# Patient Record
Sex: Male | Born: 1995 | Race: Black or African American | Hispanic: No | Marital: Single | Smoking: Current some day smoker
Health system: Southern US, Community
[De-identification: ages and names within clinical notes are randomized; demographics above are authoritative.]

---

## 2018-10-24 ENCOUNTER — Ambulatory Visit (HOSPITAL_COMMUNITY)
Admission: EM | Admit: 2018-10-24 | Discharge: 2018-10-24 | Disposition: A | Discharge status: Home / Self Care | Payer: Self-pay | Attending: Family Medicine | Admitting: Family Medicine

## 2018-10-24 ENCOUNTER — Encounter (HOSPITAL_COMMUNITY): Payer: Self-pay

## 2018-10-24 DIAGNOSIS — M542 Cervicalgia: Secondary | ICD-10-CM

## 2018-10-24 DIAGNOSIS — M546 Pain in thoracic spine: Secondary | ICD-10-CM

## 2018-10-24 MED ORDER — NAPROXEN 500 MG PO TABS: 500.0000 mg | ORAL_TABLET | Freq: Two times a day (BID) | ORAL | 0 refills | Status: DC

## 2018-10-24 MED ORDER — CYCLOBENZAPRINE HCL 10 MG PO TABS: 10.0000 mg | ORAL_TABLET | Freq: Two times a day (BID) | ORAL | 0 refills | Status: DC | PRN

## 2018-10-24 NOTE — Discharge Instructions (Signed)
Use anti-inflammatories for pain/swelling. You may take up to 800 mg Ibuprofen every 8 hours with food. You may supplement Ibuprofen with Tylenol [phone] mg every 8 hours.  OR  Naprosyn twice daily with food  You may use flexeril as needed to help with pain. This is a muscle relaxer and causes sedation- please use only at bedtime or when you will be home and not have to drive/work- cut in 1/2 if causing toom uch drowsiness  Alternate ice and heat
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
## 2018-10-28 ENCOUNTER — Ambulatory Visit (HOSPITAL_COMMUNITY)
Admission: EM | Admit: 2018-10-28 | Discharge: 2018-10-28 | Disposition: A | Discharge status: Home / Self Care | Payer: Self-pay | Attending: Emergency Medicine | Admitting: Emergency Medicine

## 2018-10-28 ENCOUNTER — Encounter (HOSPITAL_COMMUNITY): Payer: Self-pay | Admitting: Emergency Medicine

## 2018-10-28 DIAGNOSIS — M546 Pain in thoracic spine: Secondary | ICD-10-CM

## 2018-10-28 MED ORDER — MELOXICAM 15 MG PO TABS: 15.0000 mg | ORAL_TABLET | Freq: Every day | ORAL | 0 refills | Status: DC

## 2018-10-28 NOTE — Discharge Instructions (Addendum)
°  Meloxicam (Mobic) is an antiinflammatory to help with pain and inflammation.  Do not take ibuprofen, Advil, Aleve, or any other medications that contain NSAIDs while taking meloxicam as this may cause stomach upset or even ulcers if taken in large amounts for an extended period of time.   You may take acetaminophen (Tylenol) 500mg  every 4-6 hours along with the Meloxicam once daily as needed for pain.  You may apply cool and warm compresses to help with pain.   Please follow up with family medicine or an orthopedist in 1-2 weeks if not improving.
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
## 2018-10-29 ENCOUNTER — Ambulatory Visit (INDEPENDENT_AMBULATORY_CARE_PROVIDER_SITE_OTHER): Payer: BLUE CROSS/BLUE SHIELD | Admitting: Family Medicine

## 2018-10-29 ENCOUNTER — Encounter (INDEPENDENT_AMBULATORY_CARE_PROVIDER_SITE_OTHER): Payer: Self-pay | Admitting: Family Medicine

## 2018-10-29 DIAGNOSIS — M546 Pain in thoracic spine: Secondary | ICD-10-CM

## 2018-10-29 DIAGNOSIS — M542 Cervicalgia: Secondary | ICD-10-CM | POA: Diagnosis not present
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
## 2019-03-31 ENCOUNTER — Encounter (HOSPITAL_COMMUNITY): Payer: Self-pay | Admitting: *Deleted

## 2019-03-31 ENCOUNTER — Other Ambulatory Visit: Payer: Self-pay

## 2019-03-31 ENCOUNTER — Ambulatory Visit (HOSPITAL_COMMUNITY)
Admission: EM | Admit: 2019-03-31 | Discharge: 2019-03-31 | Disposition: A | Discharge status: Home / Self Care | Payer: BLUE CROSS/BLUE SHIELD | Attending: Family Medicine | Admitting: Family Medicine

## 2019-03-31 DIAGNOSIS — R350 Frequency of micturition: Secondary | ICD-10-CM | POA: Diagnosis present

## 2019-03-31 DIAGNOSIS — Z202 Contact with and (suspected) exposure to infections with a predominantly sexual mode of transmission: Secondary | ICD-10-CM

## 2019-03-31 LAB — POCT URINALYSIS DIP (DEVICE)
Bilirubin Urine: NEGATIVE
Glucose, UA: NEGATIVE mg/dL
Hgb urine dipstick: NEGATIVE
Ketones, ur: NEGATIVE mg/dL
Leukocytes,Ua: NEGATIVE
Nitrite: NEGATIVE
Protein, ur: NEGATIVE mg/dL
Specific Gravity, Urine: >=1.03 (ref 1.005–1.030)
Urobilinogen, UA: 0.2 mg/dL (ref 0.0–1.0)
pH: 6 (ref 5.0–8.0)

## 2019-03-31 MED ORDER — SULFAMETHOXAZOLE-TRIMETHOPRIM 800-160 MG PO TABS: 1.0000 | ORAL_TABLET | Freq: Two times a day (BID) | ORAL | 0 refills | Status: AC

## 2019-03-31 NOTE — Discharge Instructions (Signed)
Continue to drink plenty of water Take the antibiotic 2 x a day We did lab testing during this visit.  If there are any abnormal findings that require change in medicine or indicate a positive result, you will be notified.  If all of your tests are normal, you will not be called.

## 2019-03-31 NOTE — ED Provider Notes (Signed)
[facility]    CSN: [id number] Arrival date & time: 03/31/19  1259     History   Chief Complaint Chief Complaint  Patient presents with  . Urinary Retention    HPI Daniel Richardson is a 23 y.o. male.   HPI  Healthy on no meds Drinks a lot of water Has noticed urine frequency and the sensation that he is not emptying bladder for 2 d No hematuria, burning, purulence or rash +unprotected sex 1-2 weeks ago Desires STD testing Declines the labs HIV and RPR against my advice No abdominal or back pain No fever or chills No history kidney problems or stones No meds OTC or supplements  History reviewed. No pertinent past medical history.  There are no active problems to display for this patient.   History reviewed. No pertinent surgical history.     Home Medications    Prior to Admission medications   Medication Sig Start Date End Date Taking? Authorizing Provider  sulfamethoxazole-trimethoprim (BACTRIM DS) 800-160 MG tablet Take 1 tablet by mouth 2 (two) times daily for 10 days. 03/31/19 04/10/19  Eustace Moore, MD    Family History Family History  Problem Relation Age of Onset  . Healthy Mother   . Healthy Father     Social History Social History   Tobacco Use  . Smoking status: Never Smoker  . Smokeless tobacco: Never Used  Substance Use Topics  . Alcohol use: Not Currently  . Drug use: Yes    Types: Marijuana     Allergies   Lavender oil   Review of Systems Review of Systems  Constitutional: Negative for chills and fever.  HENT: Negative for ear pain and sore throat.   Eyes: Negative for pain and visual disturbance.  Respiratory: Negative for cough and shortness of breath.   Cardiovascular: Negative for chest pain and palpitations.  Gastrointestinal: Negative for abdominal pain and vomiting.  Genitourinary: Positive for frequency. Negative for decreased urine volume, difficulty urinating, discharge, dysuria, flank pain, genital  sores, hematuria and urgency.  Musculoskeletal: Negative for arthralgias and back pain.  Skin: Negative for color change and rash.  Neurological: Negative for seizures and syncope.  All other systems reviewed and are negative.    Physical Exam Triage Vital Signs ED Triage Vitals  Enc Vitals Group     BP 03/31/19 1311 128/85     Pulse Rate 03/31/19 1311 64     Resp 03/31/19 1311 16     Temp 03/31/19 1311 98.1 F (36.7 C)     Temp src --      SpO2 03/31/19 1311 100 %   No data found.  Updated Vital Signs BP 128/85   Pulse 64   Temp 98.1 F (36.7 C)   Resp 16   SpO2 100%      Physical Exam Constitutional:      General: He is not in acute distress.    Appearance: He is well-developed.  HENT:     Head: Normocephalic and atraumatic.  Eyes:     Conjunctiva/sclera: Conjunctivae normal.     Pupils: Pupils are equal, round, and reactive to light.  Neck:     Musculoskeletal: Normal range of motion.  Cardiovascular:     Rate and Rhythm: Normal rate.  Pulmonary:     Effort: Pulmonary effort is normal. No respiratory distress.  Abdominal:     General: There is no distension.     Palpations: Abdomen is soft.  Genitourinary:    Comments: Exam  deferred.  Rectal refused Musculoskeletal: Normal range of motion.  Skin:    General: Skin is warm and dry.  Neurological:     Mental Status: He is alert.  Psychiatric:        Mood and Affect: Mood normal.        Behavior: Behavior normal.      UC Treatments / Results  Labs (all labs ordered are listed, but only abnormal results are displayed) Labs Reviewed  URINE CULTURE  POCT URINALYSIS DIP (DEVICE)  URINE CYTOLOGY ANCILLARY ONLY    EKG None  Radiology No results found.  Procedures Procedures (including critical care time)  Medications Ordered in UC Medications - No data to display  Initial Impression / Assessment and Plan / UC Course  I have reviewed the triage vital signs and the nursing notes.   Pertinent labs & imaging results that were available during my care of the patient were reviewed by me and considered in my medical decision making (see chart for details).     Discussed causes of true urinary retention including mechanical - stone, prostate, pharmacology, neurologic.  Will treat for UTI pending STD testing Final Clinical Impressions(s) / UC Diagnoses   Final diagnoses:  Urine frequency  Possible exposure to STD     Discharge Instructions     Continue to drink plenty of water Take the antibiotic 2 x a day We did lab testing during this visit.  If there are any abnormal findings that require change in medicine or indicate a positive result, you will be notified.  If all of your tests are normal, you will not be called.      ED Prescriptions    Medication Sig Dispense Auth. Provider   sulfamethoxazole-trimethoprim (BACTRIM DS) 800-160 MG tablet Take 1 tablet by mouth 2 (two) times daily for 10 days. 20 tablet Eustace MooreNelson, Olevia Westervelt Sue, MD     Controlled Substance Prescriptions [place] Controlled Substance Registry consulted? Not Applicable   Eustace MooreNelson, Racheal Mathurin Sue, MD 03/31/19 1401

## 2019-03-31 NOTE — ED Triage Notes (Signed)
C/O feeling urinary retention and urinary urgency x 2 days.  Denies any penile discharge or pain.  Denies fevers.

## 2019-04-01 LAB — URINE CULTURE: Culture: NO GROWTH

## 2019-04-01 LAB — URINE CYTOLOGY ANCILLARY ONLY
Chlamydia: NEGATIVE
Neisseria Gonorrhea: NEGATIVE
Trichomonas: NEGATIVE

## 2019-11-10 ENCOUNTER — Ambulatory Visit (HOSPITAL_COMMUNITY)
Admission: EM | Admit: 2019-11-10 | Discharge: 2019-11-10 | Disposition: A | Discharge status: Home / Self Care | Payer: BLUE CROSS/BLUE SHIELD

## 2019-11-10 NOTE — ED Notes (Signed)
No call from patient in the lobby for registration.

## 2020-05-17 ENCOUNTER — Encounter (HOSPITAL_COMMUNITY): Payer: Self-pay

## 2020-05-17 ENCOUNTER — Ambulatory Visit (HOSPITAL_COMMUNITY)
Admission: EM | Admit: 2020-05-17 | Discharge: 2020-05-17 | Disposition: A | Discharge status: Home / Self Care | Payer: BLUE CROSS/BLUE SHIELD | Attending: Family Medicine | Admitting: Family Medicine

## 2020-05-17 DIAGNOSIS — M542 Cervicalgia: Secondary | ICD-10-CM | POA: Diagnosis not present

## 2020-05-17 DIAGNOSIS — S161XXA Strain of muscle, fascia and tendon at neck level, initial encounter: Secondary | ICD-10-CM

## 2020-05-17 MED ORDER — CYCLOBENZAPRINE HCL 10 MG PO TABS: 10.0000 mg | ORAL_TABLET | Freq: Two times a day (BID) | ORAL | 0 refills | Status: AC | PRN

## 2020-05-17 MED ORDER — IBUPROFEN 800 MG PO TABS: 800.0000 mg | ORAL_TABLET | Freq: Three times a day (TID) | ORAL | 0 refills | Status: AC | PRN

## 2020-05-17 NOTE — Discharge Instructions (Signed)
Prescribe prescription strength ibuprofen as well as Flexeril.  Flexeril can make you sleepy so do not drive or operate heavy machinery while taking this medication.  You have strained your neck muscle while you were sleeping.  If you are not getting better over the next day or 2, follow-up with this office or with primary care.  Follow-up in the ER with trouble swallowing, trouble breathing, neck swelling, throat swelling, other concerning symptoms.

## 2020-05-17 NOTE — ED Triage Notes (Signed)
Pt presents with left sided neck pain. Pt states he slept on his neck wrong, woke up with pain. Pt unable to bring head midline, position of comfort is to lean head right. Pt also states pain is worse when lifting arms above head. Pt denies OTC treatment or relieving factors. Pt denies injury or trauma to neck.

## 2020-05-17 NOTE — ED Provider Notes (Signed)
[facility]   [id number] 05/17/20 Arrival Time: 1050  QQ:IWLNL PAIN  SUBJECTIVE: History from: patient. Adal Sereno is a 24 y.o. male complains of left neck pain that began this morning. Reports that he thinks that he slept wrong last night.  Denies a precipitating event or specific injury. Describes the pain as constant and achy in character. Has tried OTC medications without relief. Symptoms are made worse with activity. Denies similar symptoms in the past. Denies fever, chills, erythema, ecchymosis, effusion, weakness, numbness and tingling, saddle paresthesias, loss of bowel or bladder function.      ROS: As per HPI.  All other pertinent ROS negative.     History reviewed. No pertinent past medical history. History reviewed. No pertinent surgical history. Allergies  Allergen Reactions  . Lavender Oil    No current facility-administered medications on file prior to encounter.   No current outpatient medications on file prior to encounter.   Social History   Socioeconomic History  . Marital status: Single    Spouse name: Not on file  . Number of children: Not on file  . Years of education: Not on file  . Highest education level: Not on file  Occupational History  . Not on file  Tobacco Use  . Smoking status: Never Smoker  . Smokeless tobacco: Never Used  Vaping Use  . Vaping Use: Never used  Substance and Sexual Activity  . Alcohol use: Not Currently  . Drug use: Yes    Types: Marijuana  . Sexual activity: Yes    Birth control/protection: None  Other Topics Concern  . Not on file  Social History Narrative  . Not on file   Social Determinants of Health   Financial Resource Strain:   . Difficulty of Paying Living Expenses:   Food Insecurity:   . Worried About Programme researcher, broadcasting/film/video in the Last Year:   . Barista in the Last Year:   Transportation Needs:   . Freight forwarder (Medical):   Marland Kitchen Lack of Transportation (Non-Medical):   Physical  Activity:   . Days of Exercise per Week:   . Minutes of Exercise per Session:   Stress:   . Feeling of Stress :   Social Connections:   . Frequency of Communication with Friends and Family:   . Frequency of Social Gatherings with Friends and Family:   . Attends Religious Services:   . Active Member of Clubs or Organizations:   . Attends Banker Meetings:   Marland Kitchen Marital Status:   Intimate Partner Violence:   . Fear of Current or Ex-Partner:   . Emotionally Abused:   Marland Kitchen Physically Abused:   . Sexually Abused:    Family History  Problem Relation Age of Onset  . Healthy Mother   . Healthy Father     OBJECTIVE:  Vitals:   05/17/20 1108  BP: 134/80  Pulse: 81  Temp: 97.9 F (36.6 C)  SpO2: 99%    General appearance: ALERT; in no acute distress.  Head: NCAT Lungs: Normal respiratory effort CV:  pulses 2+ bilaterally. Cap refill < 2 seconds Musculoskeletal:  Inspection: Skin warm, dry, clear and intact without obvious erythema, effusion, or ecchymosis.  Palpation: Nontender to palpation ROM: FROM active and passive Skin: warm and dry Neurologic: Ambulates without difficulty; Sensation intact about the upper/ lower extremities Psychological: alert and cooperative; normal mood and affect  DIAGNOSTIC STUDIES:  No results found.   ASSESSMENT & PLAN:  1. Neck  pain   2. Acute strain of neck muscle, initial encounter     Meds ordered this encounter  Medications  . ibuprofen (ADVIL) 800 MG tablet    Sig: Take 1 tablet (800 mg total) by mouth every 8 (eight) hours as needed for moderate pain.    Dispense:  21 tablet    Refill:  0    Order Specific Question:   Supervising Provider    Answer:   Chase Picket [id number]  . cyclobenzaprine (FLEXERIL) 10 MG tablet    Sig: Take 1 tablet (10 mg total) by mouth 2 (two) times daily as needed for muscle spasms.    Dispense:  20 tablet    Refill:  0    Order Specific Question:   Supervising Provider    Answer:    Chase Picket [id number]    Continue conservative management of rest, ice, and gentle stretches Take ibuprofen as needed for pain relief (may cause abdominal discomfort, ulcers, and GI bleeds avoid taking with other NSAIDs) Take cyclobenzaprine at nighttime for symptomatic relief. Avoid driving or operating heavy machinery while using medication. Follow up with PCP if symptoms persist Return or go to the ER if you have any new or worsening symptoms (fever, chills, chest pain, abdominal pain, changes in bowel or bladder habits, pain radiating into lower legs)   Reviewed expectations re: course of current medical issues. Questions answered. Outlined signs and symptoms indicating need for more acute intervention. Patient verbalized understanding. After Visit Summary given.       Faustino Congress, NP 05/17/20 1130

## 2020-05-28 ENCOUNTER — Inpatient Hospital Stay (HOSPITAL_COMMUNITY)
Admission: EM | Admit: 2020-05-28 | Discharge: 2020-06-01 | DRG: 199 | Disposition: A | Discharge status: Home / Self Care | Payer: BC Managed Care – PPO | Attending: General Surgery | Admitting: General Surgery

## 2020-05-28 ENCOUNTER — Other Ambulatory Visit: Payer: Self-pay

## 2020-05-28 ENCOUNTER — Emergency Department (HOSPITAL_COMMUNITY): Payer: BC Managed Care – PPO

## 2020-05-28 ENCOUNTER — Encounter (HOSPITAL_COMMUNITY): Payer: Self-pay | Admitting: *Deleted

## 2020-05-28 DIAGNOSIS — S2232XB Fracture of one rib, left side, initial encounter for open fracture: Secondary | ICD-10-CM | POA: Diagnosis present

## 2020-05-28 DIAGNOSIS — Z20822 Contact with and (suspected) exposure to covid-19: Secondary | ICD-10-CM | POA: Diagnosis present

## 2020-05-28 DIAGNOSIS — S272XXA Traumatic hemopneumothorax, initial encounter: Principal | ICD-10-CM | POA: Diagnosis present

## 2020-05-28 DIAGNOSIS — S27321A Contusion of lung, unilateral, initial encounter: Secondary | ICD-10-CM | POA: Diagnosis present

## 2020-05-28 DIAGNOSIS — N179 Acute kidney failure, unspecified: Secondary | ICD-10-CM | POA: Diagnosis present

## 2020-05-28 DIAGNOSIS — Z4682 Encounter for fitting and adjustment of non-vascular catheter: Secondary | ICD-10-CM

## 2020-05-28 DIAGNOSIS — D62 Acute posthemorrhagic anemia: Secondary | ICD-10-CM | POA: Diagnosis not present

## 2020-05-28 DIAGNOSIS — T1490XA Injury, unspecified, initial encounter: Secondary | ICD-10-CM | POA: Diagnosis present

## 2020-05-28 DIAGNOSIS — S2232XA Fracture of one rib, left side, initial encounter for closed fracture: Secondary | ICD-10-CM | POA: Diagnosis present

## 2020-05-28 DIAGNOSIS — S22079A Unspecified fracture of T9-T10 vertebra, initial encounter for closed fracture: Secondary | ICD-10-CM | POA: Diagnosis present

## 2020-05-28 DIAGNOSIS — Z9689 Presence of other specified functional implants: Secondary | ICD-10-CM

## 2020-05-28 DIAGNOSIS — F17200 Nicotine dependence, unspecified, uncomplicated: Secondary | ICD-10-CM | POA: Diagnosis present

## 2020-05-28 DIAGNOSIS — W3400XA Accidental discharge from unspecified firearms or gun, initial encounter: Secondary | ICD-10-CM

## 2020-05-28 DIAGNOSIS — S21432A Puncture wound without foreign body of left back wall of thorax with penetration into thoracic cavity, initial encounter: Secondary | ICD-10-CM | POA: Diagnosis present

## 2020-05-28 DIAGNOSIS — J942 Hemothorax: Secondary | ICD-10-CM

## 2020-05-28 DIAGNOSIS — R52 Pain, unspecified: Secondary | ICD-10-CM

## 2020-05-28 LAB — COMPREHENSIVE METABOLIC PANEL
ALT: 19 U/L (ref 0–44)
AST: 25 U/L (ref 15–41)
Albumin: 3.9 g/dL (ref 3.5–5.0)
Alkaline Phosphatase: 64 U/L (ref 38–126)
Anion gap: 13 (ref 5–15)
BUN: 22 mg/dL — ABNORMAL HIGH (ref 6–20)
CO2: 22 mmol/L (ref 22–32)
Calcium: 8.7 mg/dL — ABNORMAL LOW (ref 8.9–10.3)
Chloride: 103 mmol/L (ref 98–111)
Creatinine, Ser: 1.37 mg/dL — ABNORMAL HIGH (ref 0.61–1.24)
GFR calc Af Amer: >60 mL/min (ref >60)
GFR calc non Af Amer: >60 mL/min (ref >60)
Glucose, Bld: 204 mg/dL — ABNORMAL HIGH (ref 70–99)
Potassium: 3.5 mmol/L (ref 3.5–5.1)
Sodium: 138 mmol/L (ref 135–145)
Total Bilirubin: 1.2 mg/dL (ref 0.3–1.2)
Total Protein: 6.6 g/dL (ref 6.5–8.1)

## 2020-05-28 LAB — URINALYSIS, ROUTINE W REFLEX MICROSCOPIC
Bilirubin Urine: NEGATIVE
Glucose, UA: NEGATIVE mg/dL
Hgb urine dipstick: NEGATIVE
Ketones, ur: NEGATIVE mg/dL
Leukocytes,Ua: NEGATIVE
Nitrite: NEGATIVE
Protein, ur: NEGATIVE mg/dL
Specific Gravity, Urine: >1.046 — ABNORMAL HIGH (ref 1.005–1.030)
pH: 5 (ref 5.0–8.0)

## 2020-05-28 LAB — CBC
HCT: 40.5 % (ref 39.0–52.0)
Hemoglobin: 12.7 g/dL — ABNORMAL LOW (ref 13.0–17.0)
MCH: 28.6 pg (ref 26.0–34.0)
MCHC: 31.4 g/dL (ref 30.0–36.0)
MCV: 91.2 fL (ref 80.0–100.0)
Platelets: 288 10*3/uL (ref 150–400)
RBC: 4.44 MIL/uL (ref 4.22–5.81)
RDW: 12.4 % (ref 11.5–15.5)
WBC: 7.2 10*3/uL (ref 4.0–10.5)
nRBC: 0 % (ref 0.0–0.2)

## 2020-05-28 LAB — PROTIME-INR
INR: 1.3 — ABNORMAL HIGH (ref 0.8–1.2)
Prothrombin Time: 15.5 seconds — ABNORMAL HIGH (ref 11.4–15.2)

## 2020-05-28 LAB — I-STAT CHEM 8, ED
BUN: 25 mg/dL — ABNORMAL HIGH (ref 6–20)
Calcium, Ion: 1.17 mmol/L (ref 1.15–1.40)
Chloride: 100 mmol/L (ref 98–111)
Creatinine, Ser: 1.3 mg/dL — ABNORMAL HIGH (ref 0.61–1.24)
Glucose, Bld: 198 mg/dL — ABNORMAL HIGH (ref 70–99)
HCT: 39 % (ref 39.0–52.0)
Hemoglobin: 13.3 g/dL (ref 13.0–17.0)
Potassium: 3.6 mmol/L (ref 3.5–5.1)
Sodium: 141 mmol/L (ref 135–145)
TCO2: 26 mmol/L (ref 22–32)

## 2020-05-28 LAB — LACTIC ACID, PLASMA: Lactic Acid, Venous: 5 mmol/L (ref 0.5–1.9)

## 2020-05-28 LAB — SARS CORONAVIRUS 2 BY RT PCR (HOSPITAL ORDER, PERFORMED IN ~~LOC~~ HOSPITAL LAB): SARS Coronavirus 2: NEGATIVE

## 2020-05-28 LAB — TRAUMA TEG PANEL
CFF Max Amplitude: 18.2 mm (ref 15–32)
Citrated Kaolin (R): 4.2 min — ABNORMAL LOW (ref 4.6–9.1)
Citrated Rapid TEG (MA): 56 mm (ref 52–70)
Lysis at 30 Minutes: 0.2 % (ref 0.0–2.6)

## 2020-05-28 LAB — HIV ANTIBODY (ROUTINE TESTING W REFLEX): HIV Screen 4th Generation wRfx: NONREACTIVE

## 2020-05-28 LAB — ETHANOL: Alcohol, Ethyl (B): <10 mg/dL (ref <10)

## 2020-05-28 MED ORDER — ACETAMINOPHEN 500 MG PO TABS
1000.0000 mg | ORAL_TABLET | Freq: Four times a day (QID) | ORAL | Status: DC
  Administered 2020-05-28 – 2020-06-01 (×15): 1000 mg via ORAL
  Filled 2020-05-28 (×16): qty 2

## 2020-05-28 MED ORDER — LACTATED RINGERS IV BOLUS
1000.0000 mL | Freq: Once | INTRAVENOUS | Status: AC
  Administered 2020-05-28: 1000 mL via INTRAVENOUS

## 2020-05-28 MED ORDER — LIDOCAINE-EPINEPHRINE (PF) 2 %-1:200000 IJ SOLN
INTRAMUSCULAR | Status: AC
  Filled 2020-05-28: qty 20

## 2020-05-28 MED ORDER — DOCUSATE SODIUM 100 MG PO CAPS
100.0000 mg | ORAL_CAPSULE | Freq: Two times a day (BID) | ORAL | Status: DC
  Administered 2020-05-28 – 2020-06-01 (×9): 100 mg via ORAL
  Filled 2020-05-28 (×8): qty 1

## 2020-05-28 MED ORDER — MORPHINE SULFATE (PF) 4 MG/ML IV SOLN
4.0000 mg | INTRAVENOUS | Status: DC | PRN
  Administered 2020-05-28: 4 mg via INTRAVENOUS
  Filled 2020-05-28: qty 1

## 2020-05-28 MED ORDER — LACTATED RINGERS IV SOLN: INTRAVENOUS | Status: DC

## 2020-05-28 MED ORDER — MORPHINE SULFATE (PF) 2 MG/ML IV SOLN: 1.0000 mg | INTRAVENOUS | Status: DC | PRN

## 2020-05-28 MED ORDER — IOHEXOL 300 MG/ML  SOLN
100.0000 mL | Freq: Once | INTRAMUSCULAR | Status: AC | PRN
  Administered 2020-05-28: 100 mL via INTRAVENOUS

## 2020-05-28 MED ORDER — METOPROLOL TARTRATE 5 MG/5ML IV SOLN: 5.0000 mg | Freq: Four times a day (QID) | INTRAVENOUS | Status: DC | PRN

## 2020-05-28 MED ORDER — MORPHINE SULFATE (PF) 2 MG/ML IV SOLN
2.0000 mg | INTRAVENOUS | Status: DC | PRN
  Administered 2020-05-28 – 2020-05-29 (×4): 2 mg via INTRAVENOUS
  Filled 2020-05-28 (×4): qty 1

## 2020-05-28 MED ORDER — METHOCARBAMOL 500 MG PO TABS
1000.0000 mg | ORAL_TABLET | Freq: Three times a day (TID) | ORAL | Status: DC
  Administered 2020-05-28 – 2020-05-30 (×6): 1000 mg via ORAL
  Filled 2020-05-28 (×6): qty 2

## 2020-05-28 MED ORDER — ONDANSETRON HCL 4 MG/2ML IJ SOLN
4.0000 mg | Freq: Four times a day (QID) | INTRAMUSCULAR | Status: DC | PRN
  Administered 2020-05-28 – 2020-05-30 (×3): 4 mg via INTRAVENOUS
  Filled 2020-05-28 (×3): qty 2

## 2020-05-28 MED ORDER — FENTANYL CITRATE (PF) 100 MCG/2ML IJ SOLN
INTRAMUSCULAR | Status: AC
  Administered 2020-05-28: 100 ug via INTRAVENOUS
  Filled 2020-05-28: qty 2

## 2020-05-28 MED ORDER — OXYCODONE HCL 5 MG PO TABS
10.0000 mg | ORAL_TABLET | ORAL | Status: DC | PRN
  Administered 2020-05-29 – 2020-05-30 (×4): 10 mg via ORAL
  Filled 2020-05-28 (×5): qty 2

## 2020-05-28 MED ORDER — OXYCODONE HCL 5 MG PO TABS
5.0000 mg | ORAL_TABLET | ORAL | Status: DC | PRN
  Administered 2020-05-29 – 2020-05-31 (×4): 5 mg via ORAL
  Filled 2020-05-28 (×4): qty 1

## 2020-05-28 MED ORDER — ONDANSETRON 4 MG PO TBDP: 4.0000 mg | ORAL_TABLET | Freq: Four times a day (QID) | ORAL | Status: DC | PRN

## 2020-05-28 NOTE — ED Notes (Signed)
1 pr. Socks , 1 pr. Black shorts 1 pr. Boxer shorts placed in paper bag

## 2020-05-28 NOTE — Progress Notes (Signed)
Orthopedic Tech Progress Note Patient Details:  Daniel Richardson 26-Sep-1996 [id number] Called in brace Patient ID: [name], male   DOB: 1995/12/10, 24 y.o.   MRN: [id number]   Michelle Piper 05/28/2020, 12:55 PM

## 2020-05-28 NOTE — Plan of Care (Signed)

## 2020-05-28 NOTE — Procedures (Signed)
   Procedure Note  Date: 05/28/2020  Procedure: tube thoracostomy--left    Pre-op diagnosis: left hemothorax  Post-op diagnosis: same  Surgeon: Diamantina Monks, MD  Anesthesia: local   EBL: <5cc procedural; 500cc blood evacuated Drains/Implants: 40F chest tube Specimen: none  Description of procedure: Verbal consent obtained form the patient. Twenty cc's of local anesthetic was infiltrated into the tissues just over the fourth intercostal space.  A longitudinal incision was made parallel to the rib at the fourth intercostal space. This incision was deepened down through the muscle until the pleural cavity was entered. About 100cc's of blood was evacuated upon entry and a 40F chest tube was inserted through this tract. The tube was secured at the skin with suture and connected to an atrium at -20cm water wall suction. Immediate output from the chest tube was 400cc and was bloody. The site was dressed with xeroform, gauze, and tape. The patient tolerated the procedure well. There were no complications. Follow up chest x-ray was ordered to confirm tube positioning, complete evacuation, and complete lung re-expansion.    Diamantina Monks, MD General and Trauma Surgery [facility]
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
## 2020-05-28 NOTE — ED Notes (Signed)
Gave patient some water patient is resting with call bell in reach 
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
## 2020-05-28 NOTE — ED Notes (Signed)
Gave patient a urinal  
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
## 2020-05-29 ENCOUNTER — Inpatient Hospital Stay (HOSPITAL_COMMUNITY): Payer: BC Managed Care – PPO

## 2020-05-29 LAB — BASIC METABOLIC PANEL
Anion gap: 6 (ref 5–15)
BUN: 14 mg/dL (ref 6–20)
CO2: 28 mmol/L (ref 22–32)
Calcium: 8.6 mg/dL — ABNORMAL LOW (ref 8.9–10.3)
Chloride: 101 mmol/L (ref 98–111)
Creatinine, Ser: 1 mg/dL (ref 0.61–1.24)
GFR calc Af Amer: >60 mL/min (ref >60)
GFR calc non Af Amer: >60 mL/min (ref >60)
Glucose, Bld: 100 mg/dL — ABNORMAL HIGH (ref 70–99)
Potassium: 4.1 mmol/L (ref 3.5–5.1)
Sodium: 135 mmol/L (ref 135–145)

## 2020-05-29 LAB — CBC
HCT: 27.6 % — ABNORMAL LOW (ref 39.0–52.0)
Hemoglobin: 9.4 g/dL — ABNORMAL LOW (ref 13.0–17.0)
MCH: 29.6 pg (ref 26.0–34.0)
MCHC: 34.1 g/dL (ref 30.0–36.0)
MCV: 86.8 fL (ref 80.0–100.0)
Platelets: 191 10*3/uL (ref 150–400)
RBC: 3.18 MIL/uL — ABNORMAL LOW (ref 4.22–5.81)
RDW: 12.3 % (ref 11.5–15.5)
WBC: 9.2 10*3/uL (ref 4.0–10.5)
nRBC: 0 % (ref 0.0–0.2)

## 2020-05-29 LAB — SAMPLE TO BLOOD BANK

## 2020-05-29 MED ORDER — POLYETHYLENE GLYCOL 3350 17 G PO PACK
17.0000 g | PACK | Freq: Every day | ORAL | Status: DC
  Administered 2020-05-29 – 2020-06-01 (×4): 17 g via ORAL
  Filled 2020-05-29 (×4): qty 1

## 2020-05-29 MED ORDER — ENOXAPARIN SODIUM 30 MG/0.3ML ~~LOC~~ SOLN
30.0000 mg | Freq: Two times a day (BID) | SUBCUTANEOUS | Status: DC
  Administered 2020-05-29 – 2020-06-01 (×7): 30 mg via SUBCUTANEOUS
  Filled 2020-05-29 (×7): qty 0.3

## 2020-05-29 MED ORDER — MORPHINE SULFATE (PF) 2 MG/ML IV SOLN
2.0000 mg | INTRAVENOUS | Status: DC | PRN
  Administered 2020-05-30: 2 mg via INTRAVENOUS
  Filled 2020-05-29 (×2): qty 1

## 2020-05-29 MED ORDER — BISACODYL 10 MG RE SUPP
10.0000 mg | Freq: Once | RECTAL | Status: AC
  Administered 2020-05-29: 10 mg via RECTAL
  Filled 2020-05-29: qty 1

## 2020-05-29 MED ORDER — KETOROLAC TROMETHAMINE 15 MG/ML IJ SOLN
15.0000 mg | Freq: Four times a day (QID) | INTRAMUSCULAR | Status: DC
  Administered 2020-05-29 – 2020-06-01 (×13): 15 mg via INTRAVENOUS
  Filled 2020-05-29 (×13): qty 1
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
## 2020-05-29 NOTE — Plan of Care (Signed)

## 2020-05-30 ENCOUNTER — Inpatient Hospital Stay (HOSPITAL_COMMUNITY): Payer: BC Managed Care – PPO

## 2020-05-30 LAB — CBC
HCT: 26.5 % — ABNORMAL LOW (ref 39.0–52.0)
Hemoglobin: 9.1 g/dL — ABNORMAL LOW (ref 13.0–17.0)
MCH: 29.7 pg (ref 26.0–34.0)
MCHC: 34.3 g/dL (ref 30.0–36.0)
MCV: 86.6 fL (ref 80.0–100.0)
Platelets: 200 10*3/uL (ref 150–400)
RBC: 3.06 MIL/uL — ABNORMAL LOW (ref 4.22–5.81)
RDW: 11.9 % (ref 11.5–15.5)
WBC: 7.1 10*3/uL (ref 4.0–10.5)
nRBC: 0 % (ref 0.0–0.2)

## 2020-05-30 MED ORDER — SIMETHICONE 80 MG PO CHEW
80.0000 mg | CHEWABLE_TABLET | Freq: Four times a day (QID) | ORAL | Status: DC | PRN
  Administered 2020-05-30 – 2020-05-31 (×2): 80 mg via ORAL
  Filled 2020-05-30 (×3): qty 1

## 2020-05-30 MED ORDER — DIPHENHYDRAMINE HCL 25 MG PO CAPS
25.0000 mg | ORAL_CAPSULE | Freq: Four times a day (QID) | ORAL | Status: DC | PRN
  Administered 2020-05-30 – 2020-05-31 (×3): 25 mg via ORAL
  Filled 2020-05-30 (×3): qty 1

## 2020-05-30 MED ORDER — CYCLOBENZAPRINE HCL 10 MG PO TABS
5.0000 mg | ORAL_TABLET | Freq: Three times a day (TID) | ORAL | Status: DC
  Administered 2020-05-30 – 2020-06-01 (×7): 5 mg via ORAL
  Filled 2020-05-30 (×7): qty 1

## 2020-05-30 MED ORDER — FLEET ENEMA 7-19 GM/118ML RE ENEM
1.0000 | ENEMA | Freq: Once | RECTAL | Status: AC
  Administered 2020-05-30: 1 via RECTAL
  Filled 2020-05-30: qty 1

## 2020-05-30 NOTE — Plan of Care (Signed)
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
## 2020-05-31 ENCOUNTER — Inpatient Hospital Stay (HOSPITAL_COMMUNITY): Payer: BC Managed Care – PPO

## 2020-05-31 ENCOUNTER — Encounter (HOSPITAL_COMMUNITY): Payer: Self-pay

## 2020-05-31 LAB — CBC
HCT: 25.9 % — ABNORMAL LOW (ref 39.0–52.0)
Hemoglobin: 8.7 g/dL — ABNORMAL LOW (ref 13.0–17.0)
MCH: 29 pg (ref 26.0–34.0)
MCHC: 33.6 g/dL (ref 30.0–36.0)
MCV: 86.3 fL (ref 80.0–100.0)
Platelets: 195 10*3/uL (ref 150–400)
RBC: 3 MIL/uL — ABNORMAL LOW (ref 4.22–5.81)
RDW: 11.9 % (ref 11.5–15.5)
WBC: 4.9 10*3/uL (ref 4.0–10.5)
nRBC: 0 % (ref 0.0–0.2)

## 2020-05-31 MED ORDER — SENNA 8.6 MG PO TABS
1.0000 | ORAL_TABLET | Freq: Every day | ORAL | Status: DC
  Administered 2020-05-31: 8.6 mg via ORAL
  Filled 2020-05-31: qty 1
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
## 2020-05-31 NOTE — Progress Notes (Signed)
Dr. Fredricka Bonine at bedside, chest tube d/c'd without incident. Pt's nurse made aware for CXR and pain medicine.  Family at bedside. O2 sats at 100% during and after procedure.   Anell Barr, RN TRN

## 2020-06-01 MED ORDER — CYCLOBENZAPRINE HCL 5 MG PO TABS: 5.0000 mg | ORAL_TABLET | Freq: Three times a day (TID) | ORAL | 0 refills | Status: AC | PRN

## 2020-06-01 MED ORDER — APAP 325 MG PO TABS: 650.0000 mg | ORAL_TABLET | Freq: Four times a day (QID) | ORAL | Status: AC | PRN

## 2020-06-01 MED ORDER — OXYCODONE HCL 5 MG PO TABS: 5.0000 mg | ORAL_TABLET | Freq: Four times a day (QID) | ORAL | 0 refills | Status: AC | PRN

## 2020-06-01 MED ORDER — POLYETHYLENE GLYCOL 3350 17 G PO PACK: 17.0000 g | PACK | Freq: Every day | ORAL | 0 refills | Status: AC | PRN

## 2020-06-01 MED FILL — CYCLOBENZAPRINE HCL 5 MG TA: 5 | 20 days supply | Qty: 60 | Fill #0

## 2020-06-01 MED FILL — oxyCODONE HCL 5 MG TABS: 5 | 4 days supply | Qty: 15 | Fill #0
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
## 2020-06-01 NOTE — Discharge Instructions (Signed)
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 rubbing and pressure on your skin and may cause sores. How to use a back brace Different types of braces will have different instructions for use. Follow instructions from your health care provider about:  How to put on the brace.  When and how often to wear the brace. In some cases, braces may need to be worn for long stretches of time. For example, a brace may need to be worn for 16-23 hours a day when used for scoliosis.  How to take off the brace.  Any safety tips you should follow when wearing the brace. This may include: ? Move carefully while wearing the brace. The brace restricts your movement and could lead to additional injuries. ? Use a cane or walker for support if you feel unsteady. ? Sit in high, firm chairs. It may be difficult to stand up from low, soft  chairs. ? Check the skin around the brace every day. Tell your health care provider about any concerns. How to care for a back brace  Do not let the back brace get wet. Typically, you will remove the brace for bathing and then put it back on afterward.  If you have a rigid brace, be sure to store it in a safe place when you are not wearing it. This will help to prevent damage.  Clean or wash the back brace with mild soap and water as told by your health care provider. Contact a health care provider if:  Your brace gets damaged.  You have pain or discomfort when wearing the back brace.  Your back pain is getting worse or is not improving over time.  You notice redness or skin breakdown from wearing the brace. Summary  You may need to wear a back brace to relieve back pain or to correct a medical condition related to the back, such as abnormal curvature of the spine (scoliosis).  Follow instructions as told by your health care provider about how to use the brace and how to take care of it.  A back brace may not help if you do not wear it as directed by your health care provider. Wear the brace exactly as instructed in order to prevent further back problems.  Move carefully while wearing the brace.  Contact a health care provider if you have pain or discomfort when wearing the back brace or your back pain is getting worse or is not improving over time. This information is not intended to replace advice given to you by your health care provider. Make sure you discuss any questions you have with your health care provider. Document Revised: 10/03/2018 Document Reviewed: 10/03/2018 Elsevier Patient Education  2020 [employer].
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
## 2020-06-01 NOTE — Progress Notes (Signed)
Patient discharging home. Discharge instructions explained to patient and he verbalized understanding. Packed all personal belongings. Dressing to Lt chest CDI. No further questions or concerns voiced.

## 2020-06-10 ENCOUNTER — Encounter (HOSPITAL_COMMUNITY): Payer: Self-pay
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
